# Patient Record
Sex: Male | Born: 1965 | Hispanic: No | Marital: Married | Smoking: Former smoker
Health system: Southern US, Community
[De-identification: ages and names within clinical notes are randomized; demographics above are authoritative.]

## PROBLEM LIST (undated history)

## (undated) DIAGNOSIS — I10 Essential (primary) hypertension: Secondary | ICD-10-CM

## (undated) DIAGNOSIS — E785 Hyperlipidemia, unspecified: Secondary | ICD-10-CM

## (undated) DIAGNOSIS — N400 Enlarged prostate without lower urinary tract symptoms: Secondary | ICD-10-CM

## (undated) HISTORY — DX: Hyperlipidemia, unspecified: E78.5

## (undated) HISTORY — DX: Essential (primary) hypertension: I10

## (undated) HISTORY — DX: Benign prostatic hyperplasia without lower urinary tract symptoms: N40.0

---
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
## 2009-11-24 ENCOUNTER — Encounter: Payer: Self-pay | Admitting: Internal Medicine

## 2010-02-09 ENCOUNTER — Ambulatory Visit: Payer: Self-pay | Admitting: Internal Medicine

## 2010-02-09 DIAGNOSIS — I10 Essential (primary) hypertension: Secondary | ICD-10-CM | POA: Insufficient documentation

## 2010-02-09 DIAGNOSIS — R0602 Shortness of breath: Secondary | ICD-10-CM | POA: Insufficient documentation

## 2010-02-09 LAB — CONVERTED CEMR LAB
BUN: 12 mg/dL (ref 6–23)
Calcium: 9.6 mg/dL (ref 8.4–10.5)
Chloride: 101 meq/L (ref 96–112)
Glucose, Bld: 90 mg/dL (ref 70–99)
Potassium: 4 meq/L (ref 3.5–5.1)
Sodium: 141 meq/L (ref 135–145)

## 2010-02-16 ENCOUNTER — Ambulatory Visit (HOSPITAL_COMMUNITY): Admission: RE | Admit: 2010-02-16 | Discharge: 2010-02-16 | Payer: Self-pay | Admitting: Internal Medicine

## 2010-02-16 ENCOUNTER — Ambulatory Visit: Payer: Self-pay | Admitting: Cardiology

## 2010-02-24 ENCOUNTER — Telehealth: Payer: Self-pay | Admitting: Internal Medicine

## 2010-11-02 NOTE — Progress Notes (Signed)
Summary: get CT results  Phone Note Call from Patient Call back at Home Phone [phone]   Caller: Patient Reason for Call: Talk to Nurse, Talk to Doctor Summary of Call: pt rtn someone's call to obtain CT results Initial call taken by: Omer Jack,  Feb 24, 2010 12:43 PM  Follow-up for Phone Call        pt given results, will start asa and simvastatin new rx sent in  [facility], RN  Feb 24, 2010 1:02 PM     New/Updated Medications: ASPIRIN 81 MG TBEC (ASPIRIN) Take one tablet by mouth daily SIMVASTATIN 20 MG TABS (SIMVASTATIN) Take one tablet by mouth daily at bedtime Prescriptions: SIMVASTATIN 20 MG TABS (SIMVASTATIN) Take one tablet by mouth daily at bedtime  #30 x 6   Entered by:   Meredith Staggers, RN   Authorized by:   Dolores Patty, MD, [facility]   Signed by:   Meredith Staggers, RN on 02/24/2010   Method used:   Electronically to        [facility]  [address] (retail)       [address]       [place], [place]  [address]       Ph: [phone] or [phone]       Fax: [phone]   RxID:   [id number]

## 2010-11-02 NOTE — Assessment & Plan Note (Signed)
Summary: OK PER DR.DAN/D.MILLER   Visit Type:  Initial Consult Primary Provider:  Dr Wyline Mood  CC:  family Hx.  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   Fairly active without much problem. Does get SOB if he pushes it. Denies CP. Doesn't exercise regularly. BP has been well controlled on meds. No edema. No palpitations. No syncope.   Last lipid panel TC 190 TG 183  HDL 35  LDL 118 Glucose 96   Preventive Screening-Counseling & Management  Alcohol-Tobacco     Smoking Status: quit  Caffeine-Diet-Exercise     Does Patient Exercise: no      Drug Use:  no.    Current Medications (verified): 1)  Adderall 20 Mg Tabs (Amphetamine-Dextroamphetamine) .... Once Daily 2)  Diovan 160 Mg Tabs (Valsartan) .... Take One Tablet By Mouth Daily 3)  Bentyl 10 Mg Caps (Dicyclomine Hcl) .... Three Times A Day 4)  Ibuprofen 800 Mg Tabs (Ibuprofen) .... Two Times A Day As Needed  Allergies (verified): 1)  ! 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 quit 1995 Alcohol Use - yes Regular Exercise - no Drug Use - no  Risk Factors: Exercise: no (02/09/2010)  Risk Factors: Smoking Status: quit (02/09/2010)  Past Medical History: HTN (diagnosed in 11/10)  Past Surgical History: L eye surgery in 1970  Family History: Reviewed history and no changes required. Mother: Family History of Cancer: died at 8 Father: Family History of Coronary Artery Disease: with CABG/CHF Family History of Diabetes:  Atrial Fib CHF Siblings: Cancer died at 47  Social History: Reviewed history  and no changes required. 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 quit 1995 Alcohol Use - yes Regular Exercise - no Drug Use - no Smoking Status:  quit Does Patient Exercise:  no Drug Use:  no  Review of Systems       As per HPI and past medical history; otherwise all systems negative.   Vital Signs:  Patient profile:   45 year old male Height:      65 inches Weight:      179 pounds BMI:     29.89 Pulse rate:   78 / minute BP sitting:   118 / 80  (left arm) Cuff size:   regular  Vitals Entered By: Vincent Estes, RMA (Feb 09, 2010 12:22 PM)  Physical Exam  General:  Gen: well appearing. no resp difficulty HEENT: normal Neck: supple. no JVD. Carotids 2+ bilat; no bruits. No lymphadenopathy or thryomegaly appreciated. Cor: PMI nondisplaced. Regular rate & rhythm. No rubs, gallops, murmur. Lungs: clear Abdomen: soft, nontender, nondistended. No hepatosplenomegaly. No bruits or masses. Good bowel sounds. Extremities: no cyanosis, clubbing, rash, edema Neuro: alert & orientedx3, cranial nerves grossly intact. moves all 4 extremities w/o difficulty. affect pleasant    Problems:  Medical Problems Added: 1)  Dx of Dyspnea  (ICD-786.05) 2)  Dx of Hypertension, Unspecified  (ICD-401.9)  Impression & Recommendations:  Problem # 1:  DYSPNEA (ICD-786.05) Given risk factors will need stress test to further evalaute. Have approached him about Promise  Trial (stress testing vs cardiac CT). Has randomized to cardiac CT>  Problem # 2:  HYPERTENSION, UNSPECIFIED (ICD-401.9) Blood pressure well controlled. Continue current regimen.  Other Orders: EKG w/ Interpretation (93000) Cardiac CTA (Cardiac CTA)  Patient Instructions: 1)  Your physician has requested that you have a cardiac CT.  Cardiac computed tomography (CT) is a painless test that uses an x-ray machine to take clear, detailed pictures of your heart.  For further information please visit  [email or web address]  Please follow instruction sheet as given.  FOR PROMISE STUDY 2)  Follow up in 1 year

## 2010-11-02 NOTE — Miscellaneous (Signed)
Summary: Orders Update  Clinical Lists Changes  Orders: Added new Test order of TLB-BMP (Basic Metabolic Panel-BMET) (80048-METABOL) - Signed 

## 2011-02-12 ENCOUNTER — Inpatient Hospital Stay (INDEPENDENT_AMBULATORY_CARE_PROVIDER_SITE_OTHER)
Admission: RE | Admit: 2011-02-12 | Discharge: 2011-02-12 | Disposition: A | Discharge status: Home / Self Care | Payer: Self-pay | Source: Ambulatory Visit | Attending: Emergency Medicine | Admitting: Emergency Medicine

## 2011-02-12 ENCOUNTER — Encounter: Payer: Self-pay | Admitting: Emergency Medicine

## 2011-02-12 DIAGNOSIS — J069 Acute upper respiratory infection, unspecified: Secondary | ICD-10-CM

## 2011-02-14 ENCOUNTER — Telehealth (INDEPENDENT_AMBULATORY_CARE_PROVIDER_SITE_OTHER): Payer: Self-pay | Admitting: *Deleted

## 2011-02-22 ENCOUNTER — Encounter: Payer: Self-pay | Admitting: Internal Medicine

## 2011-02-25 ENCOUNTER — Ambulatory Visit (INDEPENDENT_AMBULATORY_CARE_PROVIDER_SITE_OTHER): Payer: Commercial Managed Care - PPO | Admitting: Internal Medicine

## 2011-02-25 ENCOUNTER — Encounter: Payer: Self-pay | Admitting: Internal Medicine

## 2011-02-25 VITALS — BP 136/84 | HR 94 | Ht 65.0 in | Wt 181.0 lb

## 2011-02-25 DIAGNOSIS — I10 Essential (primary) hypertension: Secondary | ICD-10-CM

## 2011-02-25 DIAGNOSIS — I2510 Atherosclerotic heart disease of native coronary artery without angina pectoris: Secondary | ICD-10-CM

## 2011-02-25 DIAGNOSIS — E785 Hyperlipidemia, unspecified: Secondary | ICD-10-CM

## 2011-02-25 MED ORDER — SIMVASTATIN 40 MG PO TABS: 40.0000 mg | ORAL_TABLET | Freq: Every evening | ORAL | Status: DC

## 2011-02-25 NOTE — Assessment & Plan Note (Signed)
Mildly elevated here but well controlled at home. Continue current regimen.

## 2011-02-25 NOTE — Assessment & Plan Note (Signed)
We reviewed cardiac CT. He has significant plaquing and coronary calcium. We reviewed need for aggressive RF modification. Increase simva to 40. Encouraged daily exercise.

## 2011-02-25 NOTE — Patient Instructions (Signed)
Your physician recommends that you schedule a follow-up appointment in: 4 months   Your physician has recommended you make the following change in your medication: increase Simvastatin to 40 mg daily  Your physician recommends that you return for lab work in [place] as soon as you can

## 2011-02-25 NOTE — Progress Notes (Signed)
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   Had cardiac CT 5/11. Scattered mixed plaque < 50% with very high calcium score.   Fairly active without much problem.  Not exercising regularly. No CP. Does get SOB if he pushes it. Denies CP. BP has been well controlled on meds. No edema. No palpitations. No syncope.   Lipid panel last year:  TC 190 TG 183  HDL 35  LDL 118 Glucose 96    ROS: All systems negative except as listed in HPI, PMH and Problem List.  Past Medical History  Diagnosis Date  . HTN (hypertension)   . BPH (benign prostatic hypertrophy)   . Low testosterone   . HLD (hyperlipidemia)     Current Outpatient Prescriptions  Medication Sig Dispense Refill  . amphetamine-dextroamphetamine (ADDERALL XR) 30 MG 24 hr capsule Take 30 mg by mouth every morning.        Marland Kitchen aspirin 81 MG tablet Take 81 mg by mouth daily.        Marland Kitchen dicyclomine (BENTYL) 10 MG capsule Take 10 mg by mouth 4 (four) times daily -  before meals and at bedtime.        Marland Kitchen ibuprofen (ADVIL,MOTRIN) 800 MG tablet Take 800 mg by mouth every 8 (eight) hours as needed.        Marland Kitchen lisinopril (PRINIVIL,ZESTRIL) 20 MG tablet Take 20 mg by mouth daily.        . simvastatin (ZOCOR) 20 MG tablet Take 20 mg by mouth at bedtime.        . Tamsulosin HCl (FLOMAX) 0.4 MG CAPS Take 0.4 mg by mouth daily.        Marland Kitchen testosterone cypionate (DEPO-TESTOSTERONE) 100 MG/ML injection Inject into the muscle every 14 (fourteen) days.        Marland Kitchen DISCONTD: amphetamine-dextroamphetamine (ADDERALL) 20 MG tablet Take 20 mg by mouth daily.           PHYSICAL EXAM: Filed Vitals:   02/25/11 1608  BP: 136/84  Pulse: 94   General:  Well appearing. No resp difficulty HEENT: normal Neck: supple. JVP flat. Carotids 2+ bilaterally; no bruits. No lymphadenopathy or thryomegaly appreciated. Cor: PMI normal. Regular rate & rhythm. No rubs, gallops or murmurs. Lungs: clear Abdomen: soft, nontender,  nondistended. No hepatosplenomegaly. No bruits or masses. Good bowel sounds. Extremities: no cyanosis, clubbing, rash, edema Neuro: alert & orientedx3, cranial nerves grossly intact. Moves all 4 extremities w/o difficulty. Affect pleasant.    ECG: NSR 94 No ST-T wave abnormalities.     ASSESSMENT & PLAN:

## 2011-02-25 NOTE — Assessment & Plan Note (Signed)
Given CAD, goal LDL < 70. Increase simva to 40. Recheck CMET and liver panel.

## 2011-03-01 ENCOUNTER — Encounter: Payer: Self-pay | Admitting: Internal Medicine

## 2011-04-18 ENCOUNTER — Inpatient Hospital Stay (INDEPENDENT_AMBULATORY_CARE_PROVIDER_SITE_OTHER)
Admission: RE | Admit: 2011-04-18 | Discharge: 2011-04-18 | Disposition: A | Discharge status: Home / Self Care | Payer: Commercial Managed Care - PPO | Source: Ambulatory Visit | Attending: Emergency Medicine | Admitting: Emergency Medicine

## 2011-04-18 ENCOUNTER — Encounter: Payer: Self-pay | Admitting: Emergency Medicine

## 2011-04-18 DIAGNOSIS — R110 Nausea: Secondary | ICD-10-CM

## 2011-04-18 DIAGNOSIS — K5289 Other specified noninfective gastroenteritis and colitis: Secondary | ICD-10-CM

## 2011-06-21 ENCOUNTER — Ambulatory Visit (INDEPENDENT_AMBULATORY_CARE_PROVIDER_SITE_OTHER): Payer: 59 | Admitting: Internal Medicine

## 2011-06-21 ENCOUNTER — Encounter: Payer: Self-pay | Admitting: Internal Medicine

## 2011-06-21 VITALS — BP 120/82 | HR 87 | Ht 65.0 in | Wt 175.0 lb

## 2011-06-21 DIAGNOSIS — I10 Essential (primary) hypertension: Secondary | ICD-10-CM

## 2011-06-21 DIAGNOSIS — E785 Hyperlipidemia, unspecified: Secondary | ICD-10-CM

## 2011-06-21 DIAGNOSIS — I2510 Atherosclerotic heart disease of native coronary artery without angina pectoris: Secondary | ICD-10-CM

## 2011-06-21 NOTE — Patient Instructions (Addendum)
Your physician wants you to follow-up in: 6 months in the heart failure clinic at [facility].  [phone]) You will receive a reminder letter in the mail two months in advance. If you don't receive a letter, please call our office to schedule the follow-up appointment.  Your physician recommends that you return for lab work in:  Lipid panel in [place] (fasting)  Your physician discussed the importance of regular exercise and recommended that you start or continue a regular exercise program for good health.  Please make sure you exercise daily

## 2011-06-23 NOTE — Progress Notes (Signed)
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   Had cardiac CT 5/11. Scattered mixed plaque < 50% with very high calcium score. Simva increased to 40 daily.  Doing very well. Feels best he has in a long time. Still not exercising regularly but active at work with no problems. Tolerating simva at increased dose. Fairly active without much problem.   Denies CP. BP has been well controlled on meds. No edema. No palpitations. No syncope.     ROS: All systems negative except as listed in HPI, PMH and Problem List.  Past Medical History  Diagnosis Date  . HTN (hypertension)   . BPH (benign prostatic hypertrophy)   . Low testosterone   . HLD (hyperlipidemia)     Current Outpatient Prescriptions  Medication Sig Dispense Refill  . amphetamine-dextroamphetamine (ADDERALL XR) 30 MG 24 hr capsule Take 30 mg by mouth every morning.        Marland Kitchen aspirin 81 MG tablet Take 81 mg by mouth daily.        Marland Kitchen dicyclomine (BENTYL) 10 MG capsule Take 10 mg by mouth 4 (four) times daily -  before meals and at bedtime.        Marland Kitchen ibuprofen (ADVIL,MOTRIN) 800 MG tablet Take 800 mg by mouth every 8 (eight) hours as needed.        Marland Kitchen lisinopril (PRINIVIL,ZESTRIL) 20 MG tablet Take 20 mg by mouth daily.        . simvastatin (ZOCOR) 40 MG tablet Take 1 tablet (40 mg total) by mouth every evening.  90 tablet  3  . Tamsulosin HCl (FLOMAX) 0.4 MG CAPS Take 0.4 mg by mouth daily.        Marland Kitchen testosterone cypionate (DEPO-TESTOSTERONE) 100 MG/ML injection Inject into the muscle every 14 (fourteen) days.           PHYSICAL EXAM: Filed Vitals:   06/21/11 1133  BP: 120/82  Pulse: 87   General:  Well appearing. No resp difficulty HEENT: normal Neck: supple. JVP flat. Carotids 2+ bilaterally; no bruits. No lymphadenopathy or thryomegaly appreciated. Cor: PMI normal. Regular rate & rhythm. No rubs, gallops or murmurs. Lungs: clear Abdomen: soft, nontender, nondistended. No  hepatosplenomegaly. No bruits or masses. Good bowel sounds. Extremities: no cyanosis, clubbing, rash, edema Neuro: alert & orientedx3, cranial nerves grossly intact. Moves all 4 extremities w/o difficulty. Affect pleasant.    ECG: NSR 94 No ST-T wave abnormalities.     ASSESSMENT & PLAN:

## 2011-06-23 NOTE — Assessment & Plan Note (Signed)
Blood pressure well controlled. Continue current regimen.  

## 2011-06-23 NOTE — Assessment & Plan Note (Signed)
Non-obstructive. Pushed him on need to exercise regularly. Needs tight control of BP and lipids.

## 2011-06-23 NOTE — Assessment & Plan Note (Signed)
Simva increased recently. Need to get LDL under 70. Will repeat lipids today.

## 2011-07-06 ENCOUNTER — Encounter: Payer: Self-pay | Admitting: Family Medicine

## 2011-07-06 ENCOUNTER — Other Ambulatory Visit: Payer: Self-pay | Admitting: Family Medicine

## 2011-07-06 ENCOUNTER — Ambulatory Visit
Admission: RE | Admit: 2011-07-06 | Discharge: 2011-07-06 | Disposition: A | Discharge status: Home / Self Care | Payer: 59 | Source: Ambulatory Visit | Attending: Family Medicine | Admitting: Family Medicine

## 2011-07-06 ENCOUNTER — Inpatient Hospital Stay (INDEPENDENT_AMBULATORY_CARE_PROVIDER_SITE_OTHER)
Admission: RE | Admit: 2011-07-06 | Discharge: 2011-07-06 | Disposition: A | Discharge status: Home / Self Care | Payer: 59 | Source: Ambulatory Visit | Attending: Family Medicine | Admitting: Family Medicine

## 2011-07-06 DIAGNOSIS — S6990XA Unspecified injury of unspecified wrist, hand and finger(s), initial encounter: Secondary | ICD-10-CM

## 2011-07-06 DIAGNOSIS — L089 Local infection of the skin and subcutaneous tissue, unspecified: Secondary | ICD-10-CM

## 2011-08-21 ENCOUNTER — Emergency Department (INDEPENDENT_AMBULATORY_CARE_PROVIDER_SITE_OTHER)
Admission: EM | Admit: 2011-08-21 | Discharge: 2011-08-21 | Disposition: A | Discharge status: Home / Self Care | Payer: 59 | Source: Home / Self Care | Attending: Emergency Medicine | Admitting: Emergency Medicine

## 2011-08-21 DIAGNOSIS — J029 Acute pharyngitis, unspecified: Secondary | ICD-10-CM

## 2011-08-21 LAB — POCT RAPID STREP A (OFFICE): Rapid Strep A Screen: NEGATIVE

## 2011-08-21 MED ORDER — AMOXICILLIN 875 MG PO TABS: 875.0000 mg | ORAL_TABLET | Freq: Two times a day (BID) | ORAL | Status: AC

## 2011-08-21 MED ORDER — PREDNISONE 20 MG PO TABS: 20.0000 mg | ORAL_TABLET | Freq: Two times a day (BID) | ORAL | Status: AC

## 2011-08-21 NOTE — ED Provider Notes (Signed)
History     CSN: [id number] Arrival date & time: 08/21/2011  2:00 PM   First MD Initiated Contact with Patient 08/21/11 1413      Chief Complaint  Patient presents with  . Sore Throat    (Consider location/radiation/quality/duration/timing/severity/associated sxs/prior treatment) HPI Vincent Estes is a 45 y.o. male who complains of onset of cold symptoms for a few days.  His daughter has strep throat. + sore throat No cough No pleuritic pain No wheezing No nasal congestion No post-nasal drainage No sinus pain/pressure No chest congestion No itchy/red eyes No earache No hemoptysis No SOB No chills/sweats No fever No nausea No vomiting No abdominal pain No diarrhea No skin rashes + fatigue No myalgias No headache     Past Medical History  Diagnosis Date  . HTN (hypertension)   . BPH (benign prostatic hypertrophy)   . Low testosterone   . HLD (hyperlipidemia)     Past Surgical History  Procedure Date  . Eye surgery 1970    left    Family History  Problem Relation Age of Onset  . Cancer    . Coronary artery disease    . Diabetes    . Heart failure      History  Substance Use Topics  . Smoking status: Former Smoker    Quit date: 10/03/1993  . Smokeless tobacco: Not on file  . Alcohol Use: Yes      Review of Systems  Allergies  Erythromycin  Home Medications   Current Outpatient Rx  Name Route Sig Dispense Refill  . AMOXICILLIN 875 MG PO TABS Oral Take 1 tablet (875 mg total) by mouth 2 (two) times daily. 14 tablet 0  . AMPHETAMINE-DEXTROAMPHETAMINE 30 MG PO CP24 Oral Take 30 mg by mouth every morning.      . ASPIRIN 81 MG PO TABS Oral Take 81 mg by mouth daily.      Marland Kitchen DICYCLOMINE HCL 10 MG PO CAPS Oral Take 10 mg by mouth 4 (four) times daily -  before meals and at bedtime.      . IBUPROFEN 800 MG PO TABS Oral Take 800 mg by mouth every 8 (eight) hours as needed.      Marland Kitchen LISINOPRIL 20 MG PO TABS Oral Take 20 mg by mouth daily.      Marland Kitchen  PREDNISONE 20 MG PO TABS Oral Take 1 tablet (20 mg total) by mouth 2 (two) times daily. 6 tablet 0  . SIMVASTATIN 40 MG PO TABS Oral Take 1 tablet (40 mg total) by mouth every evening. 90 tablet 3  . TAMSULOSIN HCL 0.4 MG PO CAPS Oral Take 0.4 mg by mouth daily.      . TESTOSTERONE CYPIONATE 100 MG/ML IM OIL Intramuscular Inject into the muscle every 14 (fourteen) days.        BP 119/80  Pulse 82  Temp(Src) 98.3 F (36.8 C) (Oral)  Resp 18  Ht 5\' 5"  (1.651 m)  Wt 179 lb (81.194 kg)  BMI 29.79 kg/m2  SpO2 99%  Physical Exam  Nursing note and vitals reviewed. Constitutional: He is oriented to person, place, and time. He appears well-developed and well-nourished.  HENT:  Head: Normocephalic and atraumatic.  Right Ear: Tympanic membrane, external ear and ear canal normal.  Left Ear: Tympanic membrane, external ear and ear canal normal.  Nose: Mucosal edema and rhinorrhea present.  Mouth/Throat: Posterior oropharyngeal erythema present. No oropharyngeal exudate or posterior oropharyngeal edema.  Neck: Neck supple.  Cardiovascular: Regular rhythm and normal  heart sounds.   Pulmonary/Chest: Effort normal and breath sounds normal. No respiratory distress.  Neurological: He is alert and oriented to person, place, and time.  Skin: Skin is warm and dry.  Psychiatric: He has a normal mood and affect. His speech is normal.    ED Course  Procedures (including critical care time)   Labs Reviewed  STREP A DNA PROBE  POCT RAPID STREP A (OFFICE)   No results found.   1. Acute pharyngitis       MDM   1)  Take the prescribed antibiotic as instructed.  Hold for a few days. Rapid strep negative.  Throat culture is pending.  Instead take prednisone for sore throat symptoms. 2)  Use nasal saline solution (over the counter) at least 3 times a day. 3)  Use over the counter decongestants like Zyrtec-D every 12 hours as needed to help with congestion.  If you have hypertension, do not take  medicines with sudafed.  4)  Can take tylenol every 6 hours or motrin every 8 hours for pain or fever. 5)  Follow up with your primary doctor if no improvement in 5-7 days, sooner if increasing pain, fever, or new symptoms.     Lily Kocher, MD 08/21/11 1416

## 2011-08-21 NOTE — ED Notes (Signed)
States daughter had strep this week, symptoms started yesterday

## 2011-08-22 LAB — STREP A DNA PROBE: GASP: NEGATIVE

## 2011-08-23 ENCOUNTER — Telehealth: Payer: Self-pay | Admitting: Emergency Medicine

## 2011-09-05 NOTE — Progress Notes (Signed)
Summary: cold/TM room 5   Vital Signs:  Patient Profile:   45 Years Old Male CC:      Cold & URI symptoms Height:     65 inches Weight:      180.50 pounds O2 Sat:      97 % O2 treatment:    Room Air Temp:     98.8 degrees F oral Pulse rate:   99 / minute Resp:     16 per minute BP sitting:   118 / 72  (left arm) Cuff size:   regular  Pt. in pain?   yes    Location:   throat  Vitals Entered By: Lavell Islam RN (Feb 12, 2011 9:20 AM)                   Updated Prior Medication List: ADDERALL 20 MG TABS (AMPHETAMINE-DEXTROAMPHETAMINE) once daily BENTYL 10 MG CAPS (DICYCLOMINE HCL) three times a day IBUPROFEN 800 MG TABS (IBUPROFEN) two times a day as needed ASPIRIN 81 MG TBEC (ASPIRIN) Take one tablet by mouth daily SIMVASTATIN 20 MG TABS (SIMVASTATIN) Take one tablet by mouth daily at bedtime LISINOPRIL 20 MG TABS (LISINOPRIL) daily FLOMAX 0.4 MG CAPS (TAMSULOSIN HCL) daily DEPO-TESTOSTERONE 100 MG/ML OIL (TESTOSTERONE CYPIONATE) injection q 2 weeks  Current Allergies (reviewed today): ! ERYTHROMYCINHistory of Present Illness History from: patient Chief Complaint: Cold & URI symptoms History of Present Illness: 45 Years Old Male complains of onset of cold symptoms for a few days.  Vincent Estes has been using OTC cold meds which is helping a little bit.  His wife and daughter both have strep.  He is a cardiac ICU nurse. + sore throat + cough No pleuritic pain No wheezing + nasal congestion + post-nasal drainage + sinus pain/pressure No chest congestion No itchy/red eyes No earache No hemoptysis No SOB No chills/sweats No fever No nausea No vomiting No abdominal pain No diarrhea No skin rashes No fatigue No myalgias No headache   REVIEW OF SYSTEMS Constitutional Symptoms       Complains of fever.     Denies chills, night sweats, weight loss, weight gain, and fatigue.  Eyes       Denies change in vision, eye pain, eye discharge, glasses, contact lenses,  and eye surgery. Ear/Nose/Throat/Mouth       Complains of sore throat and hoarseness.      Denies hearing loss/aids, change in hearing, ear pain, ear discharge, dizziness, frequent runny nose, frequent nose bleeds, sinus problems, and tooth pain or bleeding.      Comments: ear tubes in past Respiratory       Complains of dry cough.      Denies productive cough, wheezing, shortness of breath, asthma, bronchitis, and emphysema/COPD.  Cardiovascular       Denies murmurs, chest pain, and tires easily with exhertion.    Gastrointestinal       Denies stomach pain, nausea/vomiting, diarrhea, constipation, blood in bowel movements, and indigestion. Genitourniary       Denies painful urination, kidney stones, and loss of urinary control. Neurological       Denies paralysis, seizures, and fainting/blackouts. Musculoskeletal       Denies muscle pain, joint pain, joint stiffness, decreased range of motion, redness, swelling, muscle weakness, and gout.  Skin       Denies bruising, unusual mles/lumps or sores, and hair/skin or nail changes.  Psych       Denies mood changes, temper/anger issues, anxiety/stress, speech problems, depression, and  sleep problems. Other Comments: sore throat and URI S&S; 2 family members with Strep   Past History:  Past Medical History: HTN (diagnosed in 11/10) BPH Low Testosterone level Hyperlipidemia  Past Surgical History: Reviewed history from 02/09/2010 and no changes required. L eye surgery in 1970  Family History: Reviewed history from 02/09/2010 and no changes required. Mother: Family History of Cancer: died at 43 Father: Family History of Coronary Artery Disease: with CABG/CHF Family History of Diabetes:  Atrial Fib CHF Siblings: Cancer died at 26  Social History: Reviewed history from 02/09/2010 and no changes required. 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 quit 1995 Alcohol Use - yes Regular Exercise - no Drug Use -  no Physical Exam General appearance: well developed, well nourished, no acute distress, coughing Ears: normal, no lesions or deformities Nasal: mucosa pink, nonedematous, no septal deviation, turbinates normal Oral/Pharynx: tongue normal, posterior pharynx without erythema or exudate Chest/Lungs: no rales, wheezes, or rhonchi bilateral, breath sounds equal without effort Heart: regular rate and  rhythm, no murmur MSE: oriented to time, place, and person Assessment New Problems: HYPERLIPIDEMIA (ICD-272.4) UPPER RESPIRATORY INFECTION, ACUTE (ICD-465.9)   Plan New Medications/Changes: AMOXICILLIN 875 MG TABS (AMOXICILLIN) 1 by mouth two times a day for 10 days  #20 x 0, 02/12/2011, Hoyt Koch MD  New Orders: New Patient Level III [phone] Services provided After hours-Weekends-Holidays [99051] Pulse Oximetry (single measurment) [94760] Rapid Strep [87880] T-Culture, Throat [[id number]] Planning Comments:   1)  Take the prescribed antibiotic as instructed.  Rapid strep neg but enough risk factors. 2)  Use nasal saline solution (over the counter) at least 3 times a day. 3)  Use over the counter decongestants like Zyrtec-D every 12 hours as needed to help with congestion. 4)  Can take tylenol every 6 hours or motrin every 8 hours for pain or fever. 5)  Follow up with your primary doctor  if no improvement in 5-7 days, sooner if increasing pain, fever, or new symptoms.    The patient and/or caregiver has been counseled thoroughly with regard to medications prescribed including dosage, schedule, interactions, rationale for use, and possible side effects and they verbalize understanding.  Diagnoses and expected course of recovery discussed and will return if not improved as expected or if the condition worsens. Patient and/or caregiver verbalized understanding.  Prescriptions: AMOXICILLIN 875 MG TABS (AMOXICILLIN) 1 by mouth two times a day for 10 days  #20 x 0   Entered and  Authorized by:   Hoyt Koch MD   Signed by:   Hoyt Koch MD on 02/12/2011   Method used:   Print then Give to Patient   RxID:   [id number]   Orders Added: 1)  New Patient Level III [28413] 2)  Services provided After hours-Weekends-Holidays [99051] 3)  Pulse Oximetry (single measurment) [94760] 4)  Rapid Strep [24401] 5)  T-Culture, Throat [[id number]]    Laboratory Results  Date/Time Received: Feb 12, 2011 9:39 AM  Date/Time Reported: Feb 12, 2011 9:39 AM   Other Tests  Rapid Strep: negative  Kit Test Internal QC: Negative   (Normal Range: Negative)

## 2011-09-05 NOTE — Letter (Signed)
Summary: Out of Work  [facility]  [address]   [place], [place] [address]   Phone: [phone]  Fax: [phone]    Feb 12, 2011   Employee:  TYRIN HERBERS    To Whom It May Concern:   For Medical reasons, please excuse the above named employee from work for the following dates:  Start:   Feb 12, 2011  End:   Feb 14, 2011           Sincerely,    Hoyt Koch MD

## 2011-09-05 NOTE — Telephone Encounter (Signed)
  Phone Note Outgoing Call Call back at [facility] Phone [phone]   Call placed by: Lajean Saver RN,  Feb 14, 2011 12:26 PM Call placed to: Patient Action Taken: Phone Call Completed Summary of Call: Callback: Patient reports he is feeling better. Negative throat culture given

## 2011-09-05 NOTE — Progress Notes (Signed)
Summary: nausea and vomitting   Vital Signs:  Patient Profile:   45 Years Old Male CC:      N, achy, fever x last night Height:     65 inches Weight:      177.75 pounds O2 Sat:      97 % O2 treatment:    Room Air Temp:     99.7 degrees F oral Pulse rate:   98 / minute Resp:     16 per minute BP sitting:   115 / 76  (left arm) Cuff size:   regular  Vitals Entered By: Clemens Catholic LPN (April 18, 2011 12:04 PM)                  Updated Prior Medication List: ADDERALL 20 MG TABS (AMPHETAMINE-DEXTROAMPHETAMINE) once daily BENTYL 10 MG CAPS (DICYCLOMINE HCL) three times a day IBUPROFEN 800 MG TABS (IBUPROFEN) two times a day as needed ASPIRIN 81 MG TBEC (ASPIRIN) Take one tablet by mouth daily SIMVASTATIN 20 MG TABS (SIMVASTATIN) Take one tablet by mouth daily at bedtime LISINOPRIL 20 MG TABS (LISINOPRIL) daily FLOMAX 0.4 MG CAPS (TAMSULOSIN HCL) daily DEPO-TESTOSTERONE 100 MG/ML OIL (TESTOSTERONE CYPIONATE) injection q 2 weeks  Current Allergies (reviewed today): ! ERYTHROMYCINHistory of Present Illness History from: patient Chief Complaint: N, achy, fever x last night History of Present Illness: N/V for a day.  Achy, chills, and not feeling well.  His wife and daughter have both had similar symptoms for the last few days.  He is a Advice worker at [employer].  He feels this is just a stomach virus but would like to stay ahead of it since his wife has been throwing up a lot.  No recent travel, no suspicious foods.  Zofran ODT has helped.  REVIEW OF SYSTEMS Constitutional Symptoms      Denies fever, chills, night sweats, weight loss, weight gain, and fatigue.  Eyes       Denies change in vision, eye pain, eye discharge, glasses, contact lenses, and eye surgery. Ear/Nose/Throat/Mouth       Denies hearing loss/aids, change in hearing, ear pain, ear discharge, dizziness, frequent runny nose, frequent nose bleeds, sinus problems, sore throat, hoarseness, and tooth pain or bleeding.    Respiratory       Denies dry cough, productive cough, wheezing, shortness of breath, asthma, bronchitis, and emphysema/COPD.  Cardiovascular       Denies murmurs, chest pain, and tires easily with exhertion.    Gastrointestinal       Denies stomach pain, nausea/vomiting, diarrhea, constipation, blood in bowel movements, and indigestion.      Comments: nausea Genitourniary       Denies painful urination, kidney stones, and loss of urinary control. Neurological       Complains of weakness.      Denies headaches, loss of or changes in sensation, numbness, tngling, tremors, paralysis, seizures, and fainting/blackouts. Musculoskeletal       Complains of muscle pain and joint pain.      Denies joint stiffness, decreased range of motion, redness, swelling, muscle weakness, and gout.  Skin       Denies bruising, unusual mles/lumps or sores, and hair/skin or nail changes.  Psych       Denies mood changes, temper/anger issues, anxiety/stress, speech problems, depression, and sleep problems. Other Comments: pt c/o nausea, fever (99.3-100.1), and achy x last night. he took zofran ODT @ 11:00AM.    Past History:  Past Medical History: Reviewed history from 02/12/2011  and no changes required. HTN (diagnosed in 11/10) BPH Low Testosterone level Hyperlipidemia  Past Surgical History: Reviewed history from 02/09/2010 and no changes required. L eye surgery in 1970  Family History: Reviewed history from 02/09/2010 and no changes required. Mother: Family History of Cancer: died at 54 Father: Family History of Coronary Artery Disease: with CABG/CHF Family History of Diabetes:  Atrial Fib CHF Siblings: Cancer died at 73  Social History: Reviewed history from 02/09/2010 and no changes required. 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 quit 1995 Alcohol Use - yes Regular Exercise - no Drug Use - no Physical Exam General appearance: well developed, well nourished, no  acute distress Ears: normal, no lesions or deformities Nasal: mucosa pink, nonedematous, no septal deviation, turbinates normal Oral/Pharynx: tongue normal, posterior pharynx without erythema or exudate Chest/Lungs: no rales, wheezes, or rhonchi bilateral, breath sounds equal without effort Heart: regular rate and  rhythm, no murmur Abdomen: mild epigatric tenderness, abdomen soft without obvious organomegaly, no rebound, no guarding MSE: oriented to time, place, and person Assessment New Problems: NAUSEA (ICD-787.02) GASTROENTERITIS (ICD-558.9)   Plan New Medications/Changes: PROMETHAZINE HCL 25 MG TABS (PROMETHAZINE HCL) 1/2 - 1 by mouth q6 hrs as needed for nausea  #25 x 0, 04/18/2011, Hoyt Koch MD  New Orders: Est. Patient Level IV [phone] Planning Comments:   Likely viral gastroenteritis since whole family is sick.  Will treat with Phenergan Rx, but may instead take Zofran ODT if that helps.  Bland diet, nothing spicy/greasy/fried/acidic for a week.  Stay hydrated and rest.  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  Follow-up with your primary care physician if not improving or if getting worse   The patient and/or caregiver has been counseled thoroughly with regard to medications prescribed including dosage, schedule, interactions, rationale for use, and possible side effects and they verbalize understanding.  Diagnoses and expected course of recovery discussed and will return if not improved as expected or if the condition worsens. Patient and/or caregiver verbalized understanding.  Prescriptions: PROMETHAZINE HCL 25 MG TABS (PROMETHAZINE HCL) 1/2 - 1 by mouth q6 hrs as needed for nausea  #25 x 0   Entered and Authorized by:   Hoyt Koch MD   Signed by:   Hoyt Koch MD on 04/18/2011   Method used:   Print then Give to Patient   RxID:   [id number]   Orders Added: 1)  Est. Patient Level IV [95621]

## 2011-09-05 NOTE — Progress Notes (Signed)
Summary: Nail gun injury to finger Procedure room   Vital Signs:  Patient Profile:   45 Years Old Male CC:      Left 1st finger, nail gun injury two days ago Tetanus-4 yrs ago Height:     65 inches Weight:      176 pounds O2 Sat:      99 % O2 treatment:    Room Air Temp:     99.2 degrees F oral Pulse rate:   96 / minute Pulse rhythm:   regular Resp:     12 per minute BP sitting:   136 / 88  (left arm) Cuff size:   regular  Vitals Entered By: Emilio Math (July 06, 2011 9:50 AM)                  Current Allergies (reviewed today): ! ERYTHROMYCINHistory of Present Illness Chief Complaint: Left 1st finger, nail gun injury two days ago Tetanus-4 yrs ago History of Present Illness:  Subjective:  Patient complains of injurying his left second finger two days ago with a pneumatic pin nailer.  A nail entered his middle phalanx, palmar surface, and he was able to easily remove it.  The finger has gradually become more painful with movement.  Last tetanus immunization was 4 years ago.  Current Meds ADDERALL 20 MG TABS (AMPHETAMINE-DEXTROAMPHETAMINE) once daily BENTYL 10 MG CAPS (DICYCLOMINE HCL) three times a day IBUPROFEN 800 MG TABS (IBUPROFEN) two times a day as needed ASPIRIN 81 MG TBEC (ASPIRIN) Take one tablet by mouth daily SIMVASTATIN 20 MG TABS (SIMVASTATIN) Take one tablet by mouth daily at bedtime LISINOPRIL 20 MG TABS (LISINOPRIL) daily FLOMAX 0.4 MG CAPS (TAMSULOSIN HCL) daily DEPO-TESTOSTERONE 100 MG/ML OIL (TESTOSTERONE CYPIONATE) injection q 2 weeks CEPHALEXIN 500 MG TABS (CEPHALEXIN) One by mouth three times daily (every 8 hours)  REVIEW OF SYSTEMS Constitutional Symptoms      Denies fever, chills, night sweats, weight loss, weight gain, and fatigue.  Eyes       Denies change in vision, eye pain, eye discharge, glasses, contact lenses, and eye surgery. Ear/Nose/Throat/Mouth       Denies hearing loss/aids, change in hearing, ear pain, ear discharge,  dizziness, frequent runny nose, frequent nose bleeds, sinus problems, sore throat, hoarseness, and tooth pain or bleeding.  Respiratory       Denies dry cough, productive cough, wheezing, shortness of breath, asthma, bronchitis, and emphysema/COPD.  Cardiovascular       Denies murmurs, chest pain, and tires easily with exhertion.    Gastrointestinal       Denies stomach pain, nausea/vomiting, diarrhea, constipation, blood in bowel movements, and indigestion. Genitourniary       Denies painful urination, kidney stones, and loss of urinary control. Neurological       Denies paralysis, seizures, and fainting/blackouts. Musculoskeletal       Denies muscle pain, joint pain, joint stiffness, decreased range of motion, redness, swelling, muscle weakness, and gout.  Skin       Denies bruising, unusual mles/lumps or sores, and hair/skin or nail changes.      Comments: Small punture wound in first finger Psych       Denies mood changes, temper/anger issues, anxiety/stress, speech problems, depression, and sleep problems.  Past History:  Past Medical History: Reviewed history from 02/12/2011 and no changes required. HTN (diagnosed in 11/10) BPH Low Testosterone level Hyperlipidemia  Past Surgical History: Reviewed history from 02/09/2010 and no changes required. L eye surgery in 1970  Family History: Reviewed history from 02/09/2010 and no changes required. Mother: Family History of Cancer: died at 8 Father: Family History of Coronary Artery Disease: with CABG/CHF Family History of Diabetes:  Atrial Fib CHF Siblings: Cancer died at 61  Social History: Reviewed history from 02/09/2010 and no changes required. 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  Mild  erythema.  Evidence of small puncture wound over middle phalanx palmar surface.  Flexion/extension is intact.  Distal neurovascular intact  X-ray left index finger:   Findings: No acute fracture is seen.  No retained foreign body is noted.  Alignment is normal and joint spaces appear normal. Assessment New Problems: UNSPEC LOCAL INFECTION SKIN&SUBCUTANEOUS TISSUE (ICD-686.9) INJURY, FINGER (ICD-959.5)  EARLY CELLULITIS  Plan New Medications/Changes: CEPHALEXIN 500 MG TABS (CEPHALEXIN) One by mouth three times daily (every 8 hours)  #30 x 0, 07/06/2011, Donna Christen MD  New Orders: T-DG Finger Index*L* [[id number]] Est. Patient Level IV [86578] Application finger Splint [29130] Planning Comments:   Bandage applied.  Begin Keflex.  Splint applied. Begin Ibuprofen 200mg , 4 tabs every 8 hours with food.   When improved in 3 to 5 days begin range of motion exercises.  Warm soaks Return for worsening symptoms (swelling, pain, redness, heat, etc)   The patient and/or caregiver has been counseled thoroughly with regard to medications prescribed including dosage, schedule, interactions, rationale for use, and possible side effects and they verbalize understanding.  Diagnoses and expected course of recovery discussed and will return if not improved as expected or if the condition worsens. Patient and/or caregiver verbalized understanding.  Prescriptions: CEPHALEXIN 500 MG TABS (CEPHALEXIN) One by mouth three times daily (every 8 hours)  #30 x 0   Entered and Authorized by:   Donna Christen MD   Signed by:   Donna Christen MD on 07/06/2011   Method used:   Print then Give to Patient   RxID:   [id number]   Orders Added: 1)  T-DG Finger Index*L* [73140] 2)  Est. Patient Level IV [10272] 3)  Application finger Splint [29130]

## 2012-01-20 ENCOUNTER — Encounter (HOSPITAL_COMMUNITY): Payer: 59

## 2012-02-02 ENCOUNTER — Ambulatory Visit (HOSPITAL_COMMUNITY)
Admission: RE | Admit: 2012-02-02 | Discharge: 2012-02-02 | Disposition: A | Discharge status: Home / Self Care | Payer: 59 | Source: Ambulatory Visit | Attending: Internal Medicine | Admitting: Internal Medicine

## 2012-02-02 VITALS — BP 128/80 | HR 90 | Wt 180.2 lb

## 2012-02-02 DIAGNOSIS — E785 Hyperlipidemia, unspecified: Secondary | ICD-10-CM | POA: Insufficient documentation

## 2012-02-02 DIAGNOSIS — I2510 Atherosclerotic heart disease of native coronary artery without angina pectoris: Secondary | ICD-10-CM | POA: Insufficient documentation

## 2012-02-02 DIAGNOSIS — I10 Essential (primary) hypertension: Secondary | ICD-10-CM | POA: Insufficient documentation

## 2012-02-02 NOTE — Progress Notes (Signed)
Patient ID: Vincent Estes, male   DOB: 1966-02-11, 46 y.o.   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   Had cardiac CT 5/11. Scattered mixed plaque < 50% with very high calcium score. Simva increased to 40 daily.  Doing well. Remains active but not exercising regularly. No significant CP or dyspnea. BP well controlled. No problems with meds.   Has not had lipids checked recently.     ROS: All systems negative except as listed in HPI, PMH and Problem List.  Past Medical History  Diagnosis Date  . HTN (hypertension)   . BPH (benign prostatic hypertrophy)   . Low testosterone   . HLD (hyperlipidemia)     Current Outpatient Prescriptions  Medication Sig Dispense Refill  . amphetamine-dextroamphetamine (ADDERALL XR) 30 MG 24 hr capsule Take 30 mg by mouth every morning.        Marland Kitchen aspirin 81 MG tablet Take 81 mg by mouth daily.        Marland Kitchen ibuprofen (ADVIL,MOTRIN) 800 MG tablet Take 800 mg by mouth every 8 (eight) hours as needed.        Marland Kitchen lisinopril (PRINIVIL,ZESTRIL) 20 MG tablet Take 20 mg by mouth daily.        . simvastatin (ZOCOR) 40 MG tablet Take 1 tablet (40 mg total) by mouth every evening.  90 tablet  3  . Tamsulosin HCl (FLOMAX) 0.4 MG CAPS Take 0.4 mg by mouth daily.        Marland Kitchen testosterone cypionate (DEPO-TESTOSTERONE) 100 MG/ML injection Inject into the muscle every 14 (fourteen) days.           PHYSICAL EXAM: Filed Vitals:   02/02/12 0913  BP: 128/80  Pulse: 90   General:  Well appearing. No resp difficulty HEENT: normal Neck: supple. JVP flat. Carotids 2+ bilaterally; no bruits. No lymphadenopathy or thryomegaly appreciated. Cor: PMI normal. Regular rate & rhythm. No rubs, gallops or murmurs. Lungs: clear Abdomen: soft, nontender, nondistended. No hepatosplenomegaly. No bruits or masses. Good bowel sounds. Extremities: no cyanosis, clubbing, rash, edema Neuro: alert & orientedx3, cranial nerves grossly  intact. Moves all 4 extremities w/o difficulty. Affect pleasant.   ECG: NSR 79 No ST-T wave abnormalities.     ASSESSMENT & PLAN:

## 2012-02-02 NOTE — Assessment & Plan Note (Signed)
Blood pressure well controlled. Continue current regimen.  

## 2012-02-02 NOTE — Assessment & Plan Note (Signed)
Need to get LDL < 70. Will recheck. Continue simva.

## 2012-02-02 NOTE — Assessment & Plan Note (Signed)
No evidence of ischemia. Continue current regimen. Suggested considering exercise program for risk reduction.

## 2012-02-03 NOTE — Progress Notes (Signed)
Encounter addended by: Cresenciano Genre on: 02/03/2012  7:36 AM<BR>     Documentation filed: Charges VN

## 2012-02-21 ENCOUNTER — Encounter (HOSPITAL_COMMUNITY): Payer: Self-pay | Admitting: *Deleted

## 2012-02-21 ENCOUNTER — Encounter (HOSPITAL_COMMUNITY): Payer: Self-pay

## 2012-04-18 ENCOUNTER — Other Ambulatory Visit: Payer: Self-pay | Admitting: Internal Medicine

## 2012-04-18 ENCOUNTER — Other Ambulatory Visit (HOSPITAL_COMMUNITY): Payer: Self-pay | Admitting: *Deleted

## 2012-04-18 MED ORDER — SIMVASTATIN 40 MG PO TABS: 40.0000 mg | ORAL_TABLET | Freq: Every evening | ORAL | Status: DC

## 2012-04-18 NOTE — Telephone Encounter (Signed)
Per Christene Lye at [facility] pharmacy will be faxing over a request on the simvastatin. Pt would  Like to pick  The meds tomorrow. Due to working schedule after 11:30 am. Thanks

## 2012-04-18 NOTE — Telephone Encounter (Signed)
Prescription was already sent in this AM, pt's wife aware

## 2017-02-23 ENCOUNTER — Emergency Department (HOSPITAL_COMMUNITY): Payer: 59

## 2017-02-23 ENCOUNTER — Emergency Department (HOSPITAL_COMMUNITY)
Admission: EM | Admit: 2017-02-23 | Discharge: 2017-02-23 | Disposition: A | Discharge status: Home / Self Care | Payer: 59 | Attending: Emergency Medicine | Admitting: Emergency Medicine

## 2017-02-23 ENCOUNTER — Encounter (HOSPITAL_COMMUNITY): Payer: Self-pay

## 2017-02-23 DIAGNOSIS — R42 Dizziness and giddiness: Secondary | ICD-10-CM | POA: Insufficient documentation

## 2017-02-23 DIAGNOSIS — I10 Essential (primary) hypertension: Secondary | ICD-10-CM | POA: Diagnosis not present

## 2017-02-23 DIAGNOSIS — Z79899 Other long term (current) drug therapy: Secondary | ICD-10-CM | POA: Diagnosis not present

## 2017-02-23 DIAGNOSIS — Z87891 Personal history of nicotine dependence: Secondary | ICD-10-CM | POA: Diagnosis not present

## 2017-02-23 LAB — CBC WITH DIFFERENTIAL/PLATELET
Basophils Absolute: 0 10*3/uL (ref 0.0–0.1)
Basophils Relative: 0 %
EOS ABS: 0.1 10*3/uL (ref 0.0–0.7)
EOS PCT: 1 %
HCT: 38.8 % — ABNORMAL LOW (ref 39.0–52.0)
Hemoglobin: 12.8 g/dL — ABNORMAL LOW (ref 13.0–17.0)
LYMPHS ABS: 2.5 10*3/uL (ref 0.7–4.0)
LYMPHS PCT: 17 %
MCH: 28.7 pg (ref 26.0–34.0)
MCHC: 33 g/dL (ref 30.0–36.0)
MCV: 87 fL (ref 78.0–100.0)
MONO ABS: 0.8 10*3/uL (ref 0.1–1.0)
MONOS PCT: 6 %
Neutro Abs: 11.3 10*3/uL — ABNORMAL HIGH (ref 1.7–7.7)
Neutrophils Relative %: 76 %
PLATELETS: 310 10*3/uL (ref 150–400)
RBC: 4.46 MIL/uL (ref 4.22–5.81)
RDW: 13.3 % (ref 11.5–15.5)
WBC: 14.8 10*3/uL — AB (ref 4.0–10.5)

## 2017-02-23 LAB — COMPREHENSIVE METABOLIC PANEL
ALT: 22 U/L (ref 17–63)
ANION GAP: 9 (ref 5–15)
AST: 31 U/L (ref 15–41)
Albumin: 3.7 g/dL (ref 3.5–5.0)
Alkaline Phosphatase: 52 U/L (ref 38–126)
BUN: 10 mg/dL (ref 6–20)
CALCIUM: 8.4 mg/dL — AB (ref 8.9–10.3)
CO2: 24 mmol/L (ref 22–32)
CREATININE: 1.16 mg/dL (ref 0.61–1.24)
Chloride: 105 mmol/L (ref 101–111)
Glucose, Bld: 125 mg/dL — ABNORMAL HIGH (ref 65–99)
Potassium: 3.6 mmol/L (ref 3.5–5.1)
SODIUM: 138 mmol/L (ref 135–145)
Total Bilirubin: 0.4 mg/dL (ref 0.3–1.2)
Total Protein: 6.3 g/dL — ABNORMAL LOW (ref 6.5–8.1)

## 2017-02-23 MED ORDER — MECLIZINE HCL 25 MG PO TABS
25.0000 mg | ORAL_TABLET | Freq: Three times a day (TID) | ORAL | 0 refills | Status: AC | PRN
Start: 2017-02-23 — End: ?

## 2017-02-23 MED ORDER — LORAZEPAM 2 MG/ML IJ SOLN
1.0000 mg | Freq: Once | INTRAMUSCULAR | Status: AC | PRN
  Administered 2017-02-23: 1 mg via INTRAVENOUS
  Filled 2017-02-23: qty 1

## 2017-02-23 MED ORDER — SODIUM CHLORIDE 0.9 % IV BOLUS (SEPSIS)
1000.0000 mL | Freq: Once | INTRAVENOUS | Status: AC
  Administered 2017-02-23: 1000 mL via INTRAVENOUS

## 2017-02-23 MED ORDER — LORAZEPAM 2 MG/ML IJ SOLN
1.0000 mg | Freq: Once | INTRAMUSCULAR | Status: AC
Start: 2017-02-23 — End: 2017-02-23
  Administered 2017-02-23: 1 mg via INTRAVENOUS
  Filled 2017-02-23: qty 1

## 2017-02-23 MED ORDER — MECLIZINE HCL 25 MG PO TABS
25.0000 mg | ORAL_TABLET | Freq: Once | ORAL | Status: AC
  Administered 2017-02-23: 25 mg via ORAL
  Filled 2017-02-23: qty 1

## 2017-02-23 NOTE — ED Notes (Signed)
Patient transported to CT 

## 2017-02-23 NOTE — ED Notes (Signed)
Patient transported to MRI 

## 2017-02-23 NOTE — ED Provider Notes (Signed)
[facility] Provider Note   CSN: [id number] Arrival date & time: 02/23/17  0011  By signing my name below, I, Freida Busman, attest that this documentation has been prepared under the direction and in the presence of Kurt Hoffmeier, Canary Brim, MD . Electronically Signed: Freida Busman, Scribe. 02/23/2017. 12:29 AM.  History   Chief Complaint Chief Complaint  Patient presents with  . Dizziness    The history is provided by the patient. No language interpreter was used.    HPI Comments:  Vincent Estes is a 51 y.o. male who presents to the Emergency Department complaining of room spinning  dizziness that began earlier today. His dizziness is worse with head movements. He reports associated nausea and vomiting. Denies h/o similar dizziness. No change to hearing or recent cold symptoms. No unilateral weakness. No alleviating factors noted.   Past Medical History:  Diagnosis Date  . BPH (benign prostatic hypertrophy)   . HLD (hyperlipidemia)   . HTN (hypertension)   . Low testosterone     Patient Active Problem List   Diagnosis Date Noted  . GASTROENTERITIS 04/18/2011  . Coronary atherosclerosis of native coronary artery 02/25/2011  . Other and unspecified hyperlipidemia 02/25/2011  . HYPERTENSION, UNSPECIFIED 02/09/2010  . DYSPNEA 02/09/2010    Past Surgical History:  Procedure Laterality Date  . EYE SURGERY  1970   left       Home Medications    Prior to Admission medications   Medication Sig Start Date End Date Taking? Authorizing Provider  ibuprofen (ADVIL,MOTRIN) 800 MG tablet Take 800 mg by mouth every 8 (eight) hours as needed for moderate pain.    Yes [provider]  omeprazole (PRILOSEC) 20 MG capsule Take 20 mg by mouth daily as needed (acid reflux).   Yes [provider]  meclizine (ANTIVERT) 25 MG tablet Take 1 tablet (25 mg total) by mouth 3 (three) times daily as needed for dizziness. 02/23/17   Gilda Crease, MD     Family History Family History  Problem Relation Age of Onset  . Cancer Unknown   . Coronary artery disease Unknown   . Diabetes Unknown   . Heart failure Unknown     Social History Social History  Substance Use Topics  . Smoking status: Former Smoker    Quit date: 10/03/1993  . Smokeless tobacco: Never Used  . Alcohol use No     Allergies   Erythromycin   Review of Systems Review of Systems  HENT: Negative for ear pain and hearing loss.   Gastrointestinal: Positive for nausea and vomiting.  Neurological: Positive for dizziness. Negative for weakness.  All other systems reviewed and are negative.   Physical Exam Updated Vital Signs BP 108/72   Pulse 85   Temp 98.1 F (36.7 C) (Oral)   Resp (!) 23   Ht 5\' 5"  (1.651 m)   Wt 82.6 kg (182 lb)   SpO2 95%   BMI 30.29 kg/m   Physical Exam  Constitutional: He is oriented to person, place, and time. He appears well-developed and well-nourished. No distress.  HENT:  Head: Normocephalic and atraumatic.  Right Ear: Hearing normal.  Left Ear: Hearing normal.  Nose: Nose normal.  Mouth/Throat: Oropharynx is clear and moist and mucous membranes are normal.  Eyes: Conjunctivae are normal. Pupils are equal, round, and reactive to light.  Left lateral  nystagmus that is fatigable   Neck: Normal range of motion. Neck supple.  Cardiovascular: Regular rhythm, S1 normal and S2 normal.  Exam reveals no gallop and no friction rub.   No murmur heard. Pulmonary/Chest: Effort normal and breath sounds normal. No respiratory distress. He exhibits no tenderness.  Abdominal: Soft. Normal appearance and bowel sounds are normal. There is no hepatosplenomegaly. There is no tenderness. There is no rebound, no guarding, no tenderness at McBurney's point and negative Murphy's sign. No hernia.  Musculoskeletal: Normal range of motion.  Neurological: He is alert and oriented to person, place, and time. He has normal strength. No cranial nerve  deficit or sensory deficit. Coordination normal. GCS eye subscore is 4. GCS verbal subscore is 5. GCS motor subscore is 6.  Skin: Skin is warm, dry and intact. No rash noted. No cyanosis.  Psychiatric: He has a normal mood and affect. His speech is normal and behavior is normal. Thought content normal.  Nursing note and vitals reviewed.    ED Treatments / Results  DIAGNOSTIC STUDIES:  Oxygen Saturation is 99% on RA, normal by my interpretation.    COORDINATION OF CARE:  12:25 AM Discussed treatment plan with pt at bedside and pt agreed to plan.  Labs (all labs ordered are listed, but only abnormal results are displayed) Labs Reviewed  CBC WITH DIFFERENTIAL/PLATELET - Abnormal; Notable for the following:       Result Value   WBC 14.8 (*)    Hemoglobin 12.8 (*)    HCT 38.8 (*)    Neutro Abs 11.3 (*)    All other components within normal limits  COMPREHENSIVE METABOLIC PANEL - Abnormal; Notable for the following:    Glucose, Bld 125 (*)    Calcium 8.4 (*)    Total Protein 6.3 (*)    All other components within normal limits    EKG  EKG Interpretation  Date/Time:  Thursday Feb 23 2017 00:47:04 EDT Ventricular Rate:  72 PR Interval:    QRS Duration: 105 QT Interval:  390 QTC Calculation: 427 R Axis:   21 Text Interpretation:  Sinus rhythm Borderline T abnormalities, anterior leads No significant change since last tracing Confirmed by Gilda Crease [phone]) on 02/23/2017 12:49:32 AM       Radiology Ct Head Wo Contrast  Result Date: 02/23/2017 CLINICAL DATA:  Acute onset of dizziness, nausea and vomiting. Initial encounter. EXAM: CT HEAD WITHOUT CONTRAST TECHNIQUE: Contiguous axial images were obtained from the base of the skull through the vertex without intravenous contrast. COMPARISON:  None. FINDINGS: Brain: No evidence of acute infarction, hemorrhage, hydrocephalus, extra-axial collection or mass lesion/mass effect. The posterior fossa, including the  cerebellum, brainstem and fourth ventricle, is within normal limits. The third and lateral ventricles, and basal ganglia are unremarkable in appearance. The cerebral hemispheres are symmetric in appearance, with normal gray-white differentiation. No mass effect or midline shift is seen. Vascular: No hyperdense vessel or unexpected calcification. Skull: There is no evidence of fracture; visualized osseous structures are unremarkable in appearance. Sinuses/Orbits: The orbits are within normal limits. Mucosal thickening is noted at the left maxillary sinus. The remaining paranasal sinuses and mastoid air cells are well-aerated. Other: No significant soft tissue abnormalities are seen. IMPRESSION: 1. No acute intracranial pathology seen on CT. 2. Mucosal thickening at the left maxillary sinus. Electronically Signed   By: Roanna Raider M.D.   On: 02/23/2017 02:23   Mr Brain Wo Contrast  Result Date: 02/23/2017 CLINICAL DATA:  Initial evaluation for acute vertigo. EXAM: MRI HEAD WITHOUT CONTRAST TECHNIQUE: Multiplanar, multiecho pulse sequences of the brain and surrounding structures were obtained without intravenous contrast.  COMPARISON:  Prior CT from earlier the same day. FINDINGS: Brain: Cerebral volume within normal limits. Few subcentimeter T2/FLAIR hyperintense foci noted within the periventricular and deep white matter both cerebral hemispheres, nonspecific, but felt to be within normal limits for age, and of doubtful significance. No abnormal foci of restricted diffusion to suggest acute or subacute ischemia. No encephalomalacia to suggest chronic infarction. No evidence for acute or chronic intracranial hemorrhage. No mass lesion, midline shift or mass effect. No hydrocephalus. No extra-axial fluid collection. Major dural sinuses are grossly patent. Pituitary gland suprasellar region within normal limits. Midline structures intact and normal. Vascular: Major intracranial vascular flow voids maintained.  Skull and upper cervical spine: Craniocervical junction normal. Visualized upper cervical spine unremarkable. Bone marrow signal intensity within normal limits. No scalp soft tissue abnormality. Sinuses/Orbits: Globes and orbital soft tissues within normal limits. Mild mucosal thickening within the maxillary sinuses, greater on the left. Paranasal sinuses are otherwise clear. No mastoid effusion. Inner ear structures grossly normal. IMPRESSION: Normal brain MRI.  No acute intracranial process identified. Electronically Signed   By: Rise MuBenjamin  McClintock M.D.   On: 02/23/2017 05:56    Procedures Procedures (including critical care time)  Medications Ordered in ED Medications  sodium chloride 0.9 % bolus 1,000 mL (0 mLs Intravenous Stopped 02/23/17 0306)  meclizine (ANTIVERT) tablet 25 mg (25 mg Oral Given 02/23/17 0039)  LORazepam (ATIVAN) injection 1 mg (1 mg Intravenous Given 02/23/17 0039)  LORazepam (ATIVAN) injection 1 mg (1 mg Intravenous Given 02/23/17 0420)     Initial Impression / Assessment and Plan / ED Course  I have reviewed the triage vital signs and the nursing notes.  Pertinent labs & imaging results that were available during my care of the patient were reviewed by me and considered in my medical decision making (see chart for details).     Patient presents to the emergency department for evaluation of dizziness. Patient has vertiginous dizziness is somewhat sudden onset. He had no focal neurologic deficits. The only finding was left lateral nystagmus, likely significant for peripheral vertigo.  Patient had some improvement with IV fluids, Ativan, oral meclizine. After this treatment, however, he still had fairly significant dizziness with standing and walking. He therefore underwent MRI to rule out posterior circulation stroke. MRI was normal, no concern for central vertigo.  Final Clinical Impressions(s) / ED Diagnoses   Final diagnoses:  Vertigo    New Prescriptions New  Prescriptions   MECLIZINE (ANTIVERT) 25 MG TABLET    Take 1 tablet (25 mg total) by mouth 3 (three) times daily as needed for dizziness.  I personally performed the services described in this documentation, which was scribed in my presence. The recorded information has been reviewed and is accurate.     Gilda CreasePollina, Markasia Carrol J, MD 02/23/17 [phone]

## 2017-02-23 NOTE — ED Triage Notes (Signed)
[facility] ems pt walking to the bathroom when pt suddenly had extreme dizziness with nausea and vomiting. Upon arrive of ems the pt was diaphoretic and pale. EMS gave 4mg  of zofran.

## 2019-04-30 IMAGING — CT CT HEAD W/O CM
3 series · 15 of 47 positions shown, 18 images · non-contrast
Comparison: None.

CLINICAL DATA: Acute onset of dizziness, nausea and vomiting.
Initial encounter.

EXAM:
CT HEAD WITHOUT CONTRAST
TECHNIQUE: Contiguous axial images were obtained from the base of the skull
through the vertex without intravenous contrast.

[Series 3: head 5.0 h30s · axial · 0.42mm/px · z∈[-56,+79]mm · 9 of 33 slices shown, 12 images]
[im 3/33  brain]
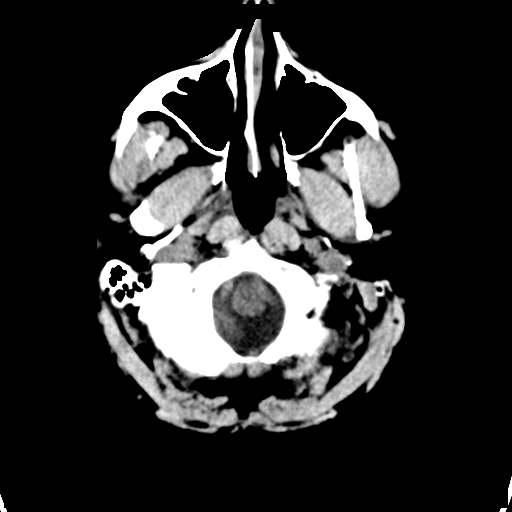
[im 3/33  bone]
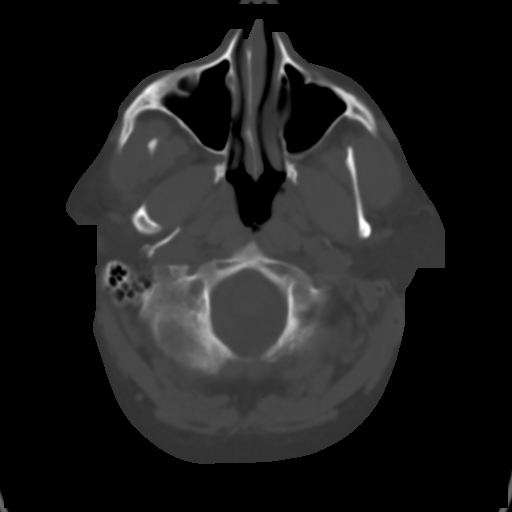
[im 6/33  brain]
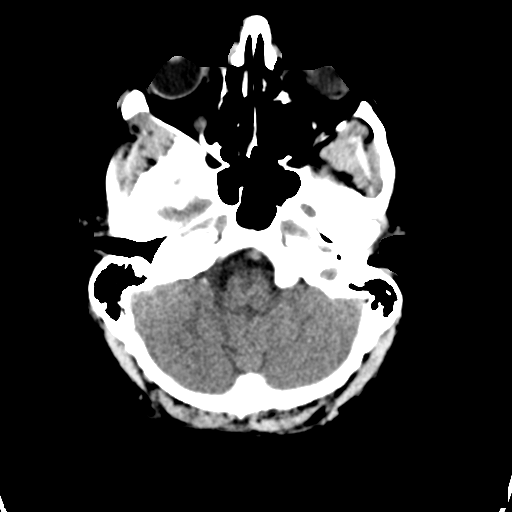
[im 9/33  brain]
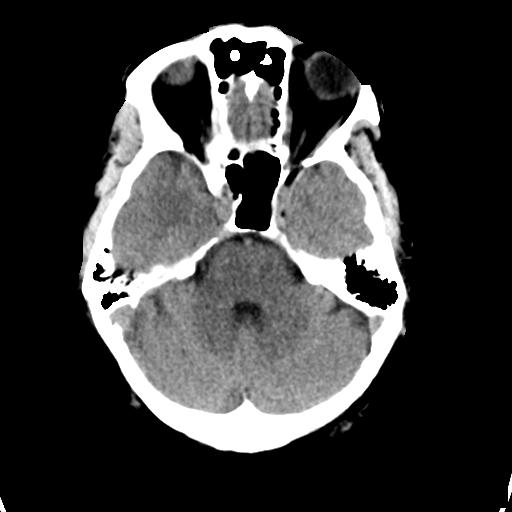
[im 13/33  brain]
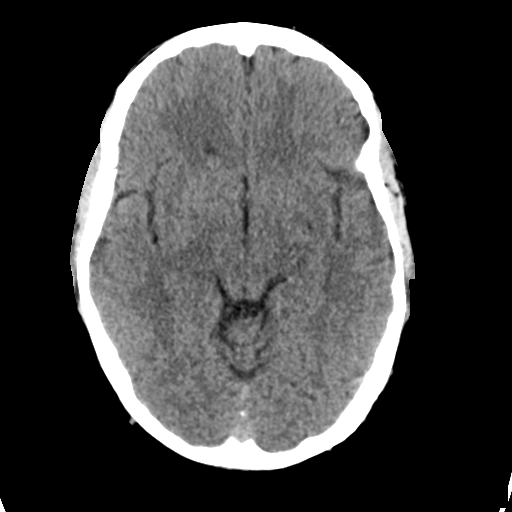
[im 17/33  brain]
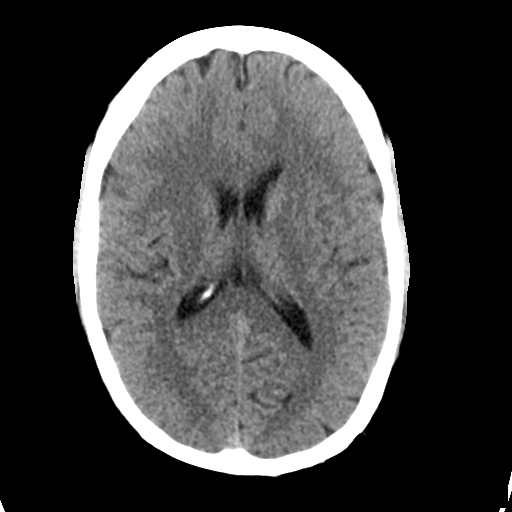
[im 17/33  bone]
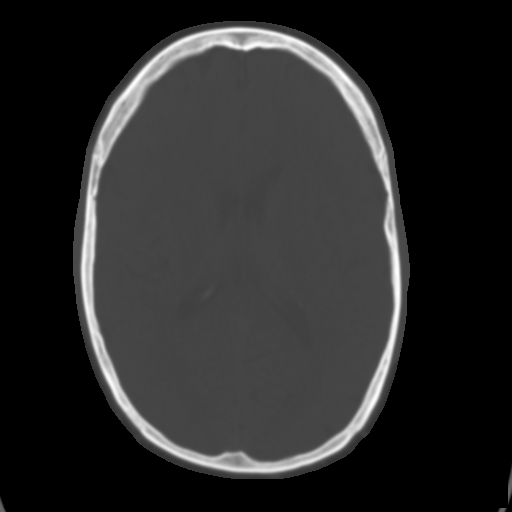
[im 20/33  brain]
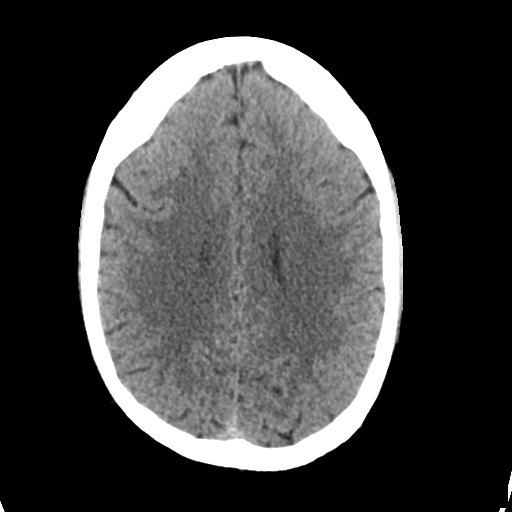
[im 24/33  brain]
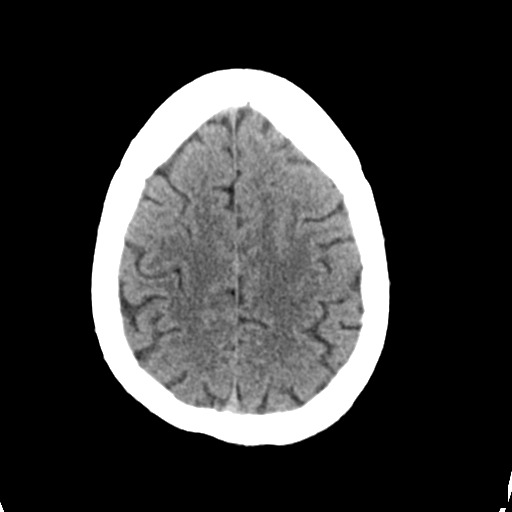
[im 27/33  brain]
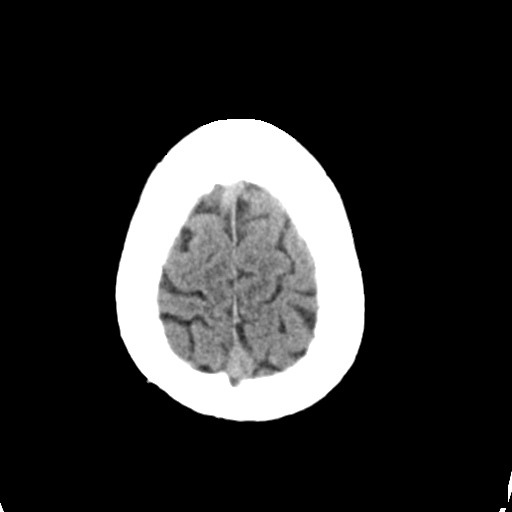
[im 30/33  brain]
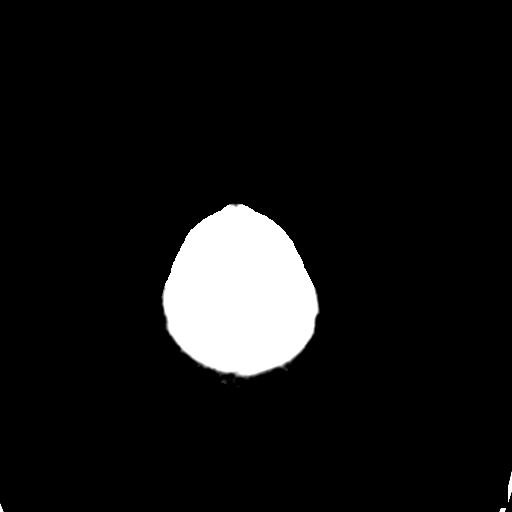
[im 30/33  bone]
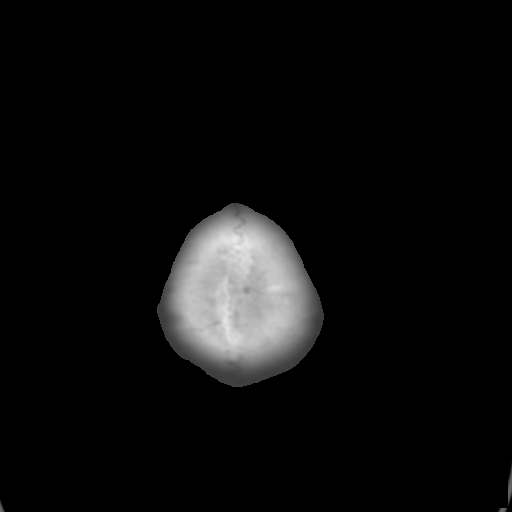

[Series 5: head 3.0 mpr cor · coronal · 0.31mm/px · 3 of 69 slices shown]
[im 23/69  brain]
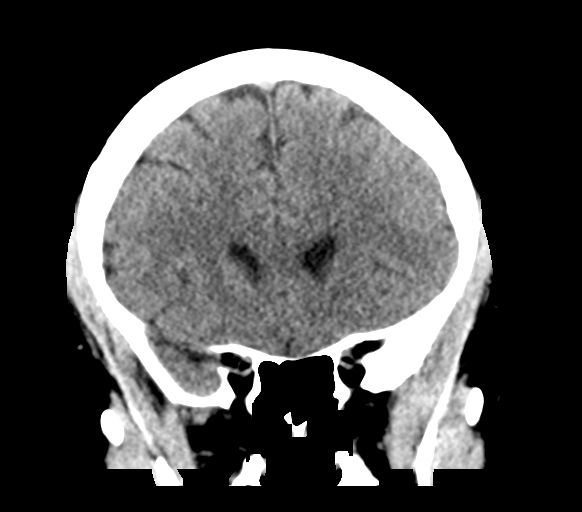
[im 31/69  brain]
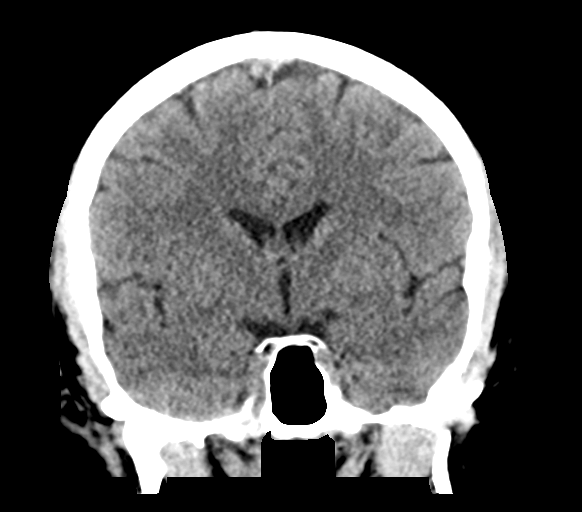
[im 38/69  brain]
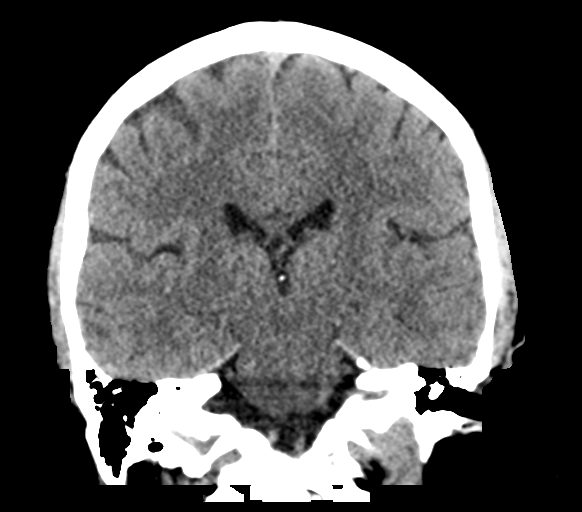

[Series 6: head 3.0 mpr sag · sagittal · 0.33mm/px · 3 of 59 slices shown]
[im 20/59  brain]
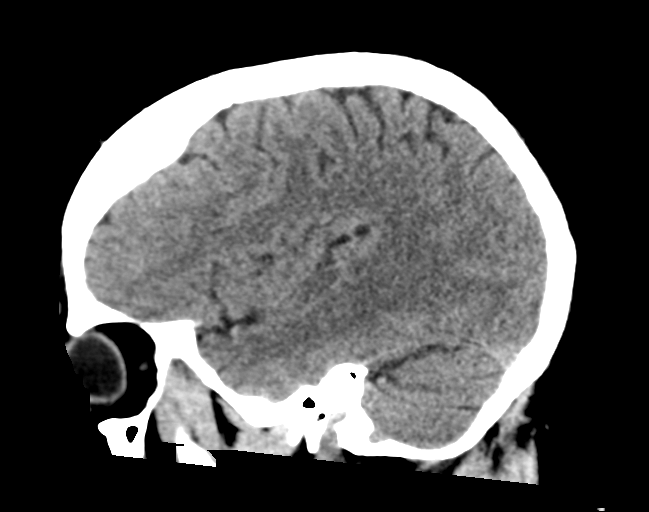
[im 30/59  brain]
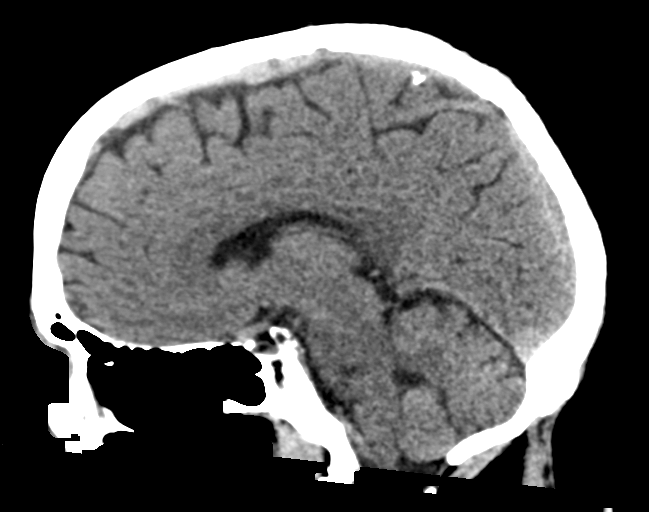
[im 39/59  brain]
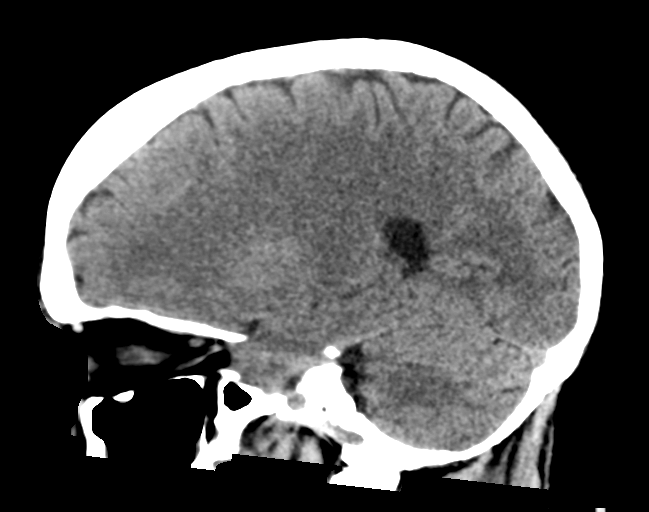

[15 of 47 positions shown; findings below may reference images not displayed]

FINDINGS: Brain: No evidence of acute infarction, hemorrhage, hydrocephalus,
extra-axial collection or mass lesion/mass effect.

The posterior fossa, including the cerebellum, brainstem and fourth
ventricle, is within normal limits. The third and lateral
ventricles, and basal ganglia are unremarkable in appearance. The
cerebral hemispheres are symmetric in appearance, with normal
gray-white differentiation. No mass effect or midline shift is seen.

Vascular: No hyperdense vessel or unexpected calcification.

Skull: There is no evidence of fracture; visualized osseous
structures are unremarkable in appearance.

Sinuses/Orbits: The orbits are within normal limits. Mucosal
thickening is noted at the left maxillary sinus. The remaining
paranasal sinuses and mastoid air cells are well-aerated.

Other: No significant soft tissue abnormalities are seen.
IMPRESSION: 1. No acute intracranial pathology seen on CT.
2. Mucosal thickening at the left maxillary sinus.
